# Patient Record
Sex: Female | Born: 2000 | Hispanic: No | Marital: Single | Smoking: Never smoker
Health system: Southern US, Community
[De-identification: ages and names within clinical notes are randomized; demographics above are authoritative.]

## PROBLEM LIST (undated history)

## (undated) DIAGNOSIS — R7303 Prediabetes: Secondary | ICD-10-CM

## (undated) DIAGNOSIS — E7800 Pure hypercholesterolemia, unspecified: Secondary | ICD-10-CM

## (undated) DIAGNOSIS — G43909 Migraine, unspecified, not intractable, without status migrainosus: Secondary | ICD-10-CM

---
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
## 2023-03-16 ENCOUNTER — Encounter (HOSPITAL_COMMUNITY): Payer: Self-pay | Admitting: Obstetrics and Gynecology

## 2023-03-16 ENCOUNTER — Inpatient Hospital Stay (HOSPITAL_COMMUNITY)
Admission: AD | Admit: 2023-03-16 | Discharge: 2023-03-16 | Disposition: A | Discharge status: Home / Self Care | Payer: Medicaid Other | Attending: Obstetrics and Gynecology | Admitting: Obstetrics and Gynecology

## 2023-03-16 DIAGNOSIS — R109 Unspecified abdominal pain: Secondary | ICD-10-CM | POA: Insufficient documentation

## 2023-03-16 DIAGNOSIS — O26892 Other specified pregnancy related conditions, second trimester: Secondary | ICD-10-CM | POA: Diagnosis not present

## 2023-03-16 DIAGNOSIS — E7800 Pure hypercholesterolemia, unspecified: Secondary | ICD-10-CM | POA: Diagnosis not present

## 2023-03-16 DIAGNOSIS — O0931 Supervision of pregnancy with insufficient antenatal care, first trimester: Secondary | ICD-10-CM | POA: Insufficient documentation

## 2023-03-16 DIAGNOSIS — O99281 Endocrine, nutritional and metabolic diseases complicating pregnancy, first trimester: Secondary | ICD-10-CM | POA: Diagnosis not present

## 2023-03-16 DIAGNOSIS — O26891 Other specified pregnancy related conditions, first trimester: Secondary | ICD-10-CM | POA: Insufficient documentation

## 2023-03-16 DIAGNOSIS — R300 Dysuria: Secondary | ICD-10-CM | POA: Insufficient documentation

## 2023-03-16 DIAGNOSIS — Z3A13 13 weeks gestation of pregnancy: Secondary | ICD-10-CM | POA: Diagnosis not present

## 2023-03-16 LAB — URINALYSIS, ROUTINE W REFLEX MICROSCOPIC
Bilirubin Urine: NEGATIVE
Glucose, UA: NEGATIVE mg/dL
Hgb urine dipstick: NEGATIVE
Ketones, ur: NEGATIVE mg/dL
Leukocytes,Ua: NEGATIVE
Nitrite: NEGATIVE
Protein, ur: 30 mg/dL — AB
Specific Gravity, Urine: 1.027 (ref 1.005–1.030)
pH: 5 (ref 5.0–8.0)

## 2023-03-16 NOTE — Discharge Instructions (Signed)
[facility] Providers  Below are a list of OB providers other than [facility] who accept [employer] for [employer] [facility] [employer] [facility]             [address] [place], [place] [address] [phone]  Center for [employer] at [facility]                                                             [address], [address], [place], [place], [address] [phone]  Center for [facility] [address], [address], [place], [place], [address] [phone]  Center for [facility] [address], [place], [place], [address] [phone]  Center for [facility]                                 [address] [place], [place], [place], [address] [phone]  Center for [facility] at [facility]                                    [address] [place], [place], [address] [phone]  Center for [facility] at [facility] [address], [address], [place], [place], [address]                              [facility] [address], [place], [place], [address] [phone]  [facility]    Phone: [phone]  [facility]      Phone: [phone]

## 2023-03-16 NOTE — MAU Provider Note (Signed)
History     [id number]  Arrival date and time: 03/16/23 1211    Chief Complaint  Patient presents with   Abdominal Pain     HPI Donna Rich is a 22 y.o. at [redacted]w[redacted]d by outside 7 wk [place], who presents for abdominal pain.   Review of records from Care Everywhere: OB US from 02/01/2023 showed IUP w [facility] of 09/18/2023  Patient reports she had abdominal pain on and off for about five minutes this morning When pain would happen would last about a minute Sharp, R sided She currently has no pain No vaginal bleeding or leaking fluid No burning or pain with urination No vaginal discharge Denies constipation Has had pain once before and was told it was normal Just wants to make sure everything is ok since this is her first pregnancy      OB History     Gravida  1   Para      Term      Preterm      AB      Living         SAB      IAB      Ectopic      Multiple      Live Births              Past Medical History:  Diagnosis Date   High cholesterol    Migraine    Pre-diabetes     No past surgical history on file.  Family History  Problem Relation Age of Onset   Healthy Mother     Social History   Socioeconomic History   Marital status: Single    Spouse name: Not on file   Number of children: Not on file   Years of education: Not on file   Highest education level: Not on file  Occupational History   Not on file  Tobacco Use   Smoking status: Never   Smokeless tobacco: Never  Vaping Use   Vaping Use: Never used  Substance and Sexual Activity   Alcohol use: Never   Drug use: Never   Sexual activity: Not on file  Other Topics Concern   Not on file  Social History Narrative   Not on file   Social Determinants of Health   Financial Resource Strain: Not on file  Food Insecurity: Not on file  Transportation Needs: Not on file  Physical Activity: Not on file  Stress: Not on file  Social Connections: Not on file  Intimate Partner  Violence: Not on file    No Known Allergies  No current facility-administered medications on file prior to encounter.   Current Outpatient Medications on File Prior to Encounter  Medication Sig Dispense Refill   cetirizine (ZYRTEC ALLERGY) 10 MG tablet Take 1 tablet (10 mg total) by mouth daily. 30 tablet 0   lidocaine (XYLOCAINE) 2 % solution Use as directed 15 mLs in the mouth or throat every 4 (four) hours as needed for mouth pain. 100 mL 0     ROS Pertinent positives and negative per HPI, all others reviewed and negative  Physical Exam   BP 130/77 (BP Location: Right Arm)   Pulse 98   Temp 98.6 F (37 C) (Oral)   Resp 14   Ht 5\' 1"  (1.549 m)   Wt 100.6 kg   SpO2 99%   BMI 41.89 kg/m   Patient Vitals for the past 24 hrs:  BP Temp Temp src Pulse Resp  SpO2 Height Weight  03/16/23 1223 130/77 98.6 F (37 C) Oral 98 14 99 % -- --  03/16/23 1220 -- -- -- -- -- -- 5\' 1"  (1.549 m) 100.6 kg    Physical Exam Vitals reviewed.  Constitutional:      General: She is not in acute distress.    Appearance: She is well-developed. She is not diaphoretic.  Eyes:     General: No scleral icterus. Pulmonary:     Effort: Pulmonary effort is normal. No respiratory distress.  Abdominal:     General: There is no distension.     Palpations: Abdomen is soft.     Tenderness: There is no abdominal tenderness. There is no guarding or rebound.  Skin:    General: Skin is warm and dry.  Neurological:     Mental Status: She is alert.     Coordination: Coordination normal.      Cervical Exam    Bedside Ultrasound Not performed.  My interpretation: n/a  FHT 158 bpm by doppler  Labs No results found for this or any previous visit (from the past 24 hour(s)).  Imaging No results found.  MAU Course  Procedures Lab Orders         Urinalysis, Routine w reflex microscopic -Urine, Clean Catch    No orders of the defined types were placed in this encounter.  Imaging Orders  No  imaging studies ordered today    MDM Moderate (Level 3-4)  Assessment and Plan  #Abdominal pain in pregnancy, second trimester #[redacted] weeks gestation of pregnancy Reassured patient that in absence of ongoing pain (she has none at present) or any other symptoms such as bleeding/dysuria/discharge/etc., low concern for any pathology though also no interventions if there are issues with pregnancy regardless. Reassured likely due to advancing gestational age/round ligament pain.  Has not yet established prenatal care, would like to go to [facility]. Encouraged to schedule appt ASAP, also given list of other providers that accept Medicaid in the area.   #FWB FHR 158 bpm by doppler   Dispo: discharged to home in stable condition    Venora Maples, MD/MPH 03/16/23 1:21 PM  Allergies as of 03/16/2023   No Known Allergies      Medication List     TAKE these medications    cetirizine 10 MG tablet Commonly known as: ZyrTEC Allergy Take 1 tablet (10 mg total) by mouth daily.   lidocaine 2 % solution Commonly known as: XYLOCAINE Use as directed 15 mLs in the mouth or throat every 4 (four) hours as needed for mouth pain.

## 2023-03-16 NOTE — MAU Note (Addendum)
.  Donna Rich is a 22 y.o. at [redacted]w[redacted]d here in MAU reporting: sharp right lower abdominal pain that started at 0830 this morning, states it has only happened x3 but she wanted to come get it checked out. Denies VB or abnormal discharge/itching or odor. Last had IC this morning prior to the pain starting. Patient took tylenol yesterday.   Onset of complaint: today at 0830 Pain score: 10 this morning but denies pain currently, last felt the pain with movement x1 hour ago Vitals:   03/16/23 1223  BP: 130/77  Pulse: 98  Resp: 14  Temp: 98.6 F (37 C)  SpO2: 99%     FHT:158 Lab orders placed from triage:  UA

## 2024-02-20 ENCOUNTER — Ambulatory Visit (HOSPITAL_COMMUNITY)
Admission: EM | Admit: 2024-02-20 | Discharge: 2024-02-20 | Disposition: A | Discharge status: Home / Self Care | Attending: Emergency Medicine | Admitting: Emergency Medicine

## 2024-02-20 ENCOUNTER — Encounter (HOSPITAL_COMMUNITY): Payer: Self-pay

## 2024-02-20 DIAGNOSIS — Z3202 Encounter for pregnancy test, result negative: Secondary | ICD-10-CM | POA: Diagnosis present

## 2024-02-20 DIAGNOSIS — Z113 Encounter for screening for infections with a predominantly sexual mode of transmission: Secondary | ICD-10-CM | POA: Diagnosis present

## 2024-02-20 DIAGNOSIS — N938 Other specified abnormal uterine and vaginal bleeding: Secondary | ICD-10-CM | POA: Insufficient documentation

## 2024-02-20 LAB — POCT URINALYSIS DIP (MANUAL ENTRY)
Bilirubin, UA: NEGATIVE
Glucose, UA: NEGATIVE mg/dL
Ketones, POC UA: NEGATIVE mg/dL
Nitrite, UA: NEGATIVE
Protein Ur, POC: 100 mg/dL — AB
Spec Grav, UA: 1.025 (ref 1.010–1.025)
Urobilinogen, UA: 0.2 U/dL
pH, UA: 6 (ref 5.0–8.0)

## 2024-02-20 LAB — POCT URINE PREGNANCY: Preg Test, Ur: NEGATIVE

## 2024-02-20 MED ORDER — ESTRADIOL 1 MG PO TABS: 1.0000 mg | ORAL_TABLET | Freq: Every day | ORAL | 0 refills | Status: AC

## 2024-02-20 NOTE — Discharge Instructions (Addendum)
 Take the Estrace  1 mg tablet by mouth daily to help stop your vaginal bleeding.  Please follow-up with your OB/GYN regarding further management of your menstrual cycles and contraception.  Sexually-transmitted infection tests were done today, our staff will contact you if the results are abnormal to initiate the appropriate treatment.  Abstain from intercourse until all results have been received.  Our staff will not call you if the results are normal, I suggest setting up MyChart to view these.  Return to clinic for any new or urgent symptoms.

## 2024-02-20 NOTE — ED Provider Notes (Signed)
 [facility]    CSN: [id number] Arrival date & time: 02/20/24  1200      History   Chief Complaint Chief Complaint  Patient presents with   Abdominal Pain   Pelvic Pain   Vaginal Discharge    HPI Donna Rich is a 23 y.o. female.   Patient presents to clinic over concerns of abdominal and pelvic pain that has been ongoing for the past week, but got much worse over the past few days.  She did have some pink vaginal discharge that turned heavier today when she went to the bathroom.  She is not bleeding in her underwear, but notices blood when she wipes.  She does have the Mirena IUD, position verified in March.  Was placed on estradiol  for breakthrough vaginal bleeding, took these for a month and these help, she has since finished this medication.  Is sexually active with her child's father.  Patient is not breast-feeding.  Reports her sexual partner has other sexual partners and is unfaithful.  Vaginal itching or vaginal discharge with odor.  Denies low back pain.  Denies dysuria.  Has not had fevers.  The history is provided by the patient, a parent and medical records.  Abdominal Pain Pelvic Pain  Vaginal Discharge   Past Medical History:  Diagnosis Date   High cholesterol    Migraine    Pre-diabetes     There are no active problems to display for this patient.   Past Surgical History:  Procedure Laterality Date   CESAREAN SECTION      OB History     Gravida  1   Para      Term      Preterm      AB      Living         SAB      IAB      Ectopic      Multiple      Live Births               Home Medications    Prior to Admission medications   Medication Sig Start Date End Date Taking? Authorizing Provider  estradiol  (ESTRACE ) 1 MG tablet Take 1 tablet (1 mg total) by mouth daily. 02/20/24  Yes Harlow Lighter, Eliav Mechling  N, FNP    Family History Family History  Problem Relation Age of Onset   Healthy Mother     Social  History Social History   Tobacco Use   Smoking status: Never   Smokeless tobacco: Never  Vaping Use   Vaping status: Never Used  Substance Use Topics   Alcohol use: Never   Drug use: Never     Allergies   Patient has no known allergies.   Review of Systems Review of Systems  Per HPI  Physical Exam Triage Vital Signs ED Triage Vitals  Encounter Vitals Group     BP 02/20/24 1346 124/84     Systolic BP Percentile --      Diastolic BP Percentile --      Pulse Rate 02/20/24 1346 91     Resp 02/20/24 1346 16     Temp 02/20/24 1346 98.1 F (36.7 C)     Temp Source 02/20/24 1346 Oral     SpO2 02/20/24 1346 98 %     Weight --      Height --      Head Circumference --      Peak Flow --  Pain Score 02/20/24 1345 5     Pain Loc --      Pain Education --      Exclude from Growth Chart --    No data found.  Updated Vital Signs BP 124/84 (BP Location: Left Arm)   Pulse 91   Temp 98.1 F (36.7 C) (Oral)   Resp 16   LMP 01/24/2024   SpO2 98%   Visual Acuity Right Eye Distance:   Left Eye Distance:   Bilateral Distance:    Right Eye Near:   Left Eye Near:    Bilateral Near:     Physical Exam Vitals and nursing note reviewed.  Constitutional:      Appearance: Normal appearance. She is well-developed.  HENT:     Head: Normocephalic and atraumatic.     Right Ear: External ear normal.     Left Ear: External ear normal.     Nose: Nose normal.     Mouth/Throat:     Mouth: Mucous membranes are moist.  Eyes:     Conjunctiva/sclera: Conjunctivae normal.  Cardiovascular:     Rate and Rhythm: Normal rate.  Pulmonary:     Effort: Pulmonary effort is normal. No respiratory distress.  Abdominal:     General: Abdomen is flat. Bowel sounds are normal.     Palpations: Abdomen is soft.     Tenderness: There is abdominal tenderness. There is no guarding or rebound.     Hernia: No hernia is present.     Comments: Mild LLQ TTP, w/o guarding or rebound    Musculoskeletal:        General: Normal range of motion.  Skin:    General: Skin is warm and dry.  Neurological:     General: No focal deficit present.     Mental Status: She is alert.  Psychiatric:        Mood and Affect: Mood normal.        Behavior: Behavior normal. Behavior is cooperative.      UC Treatments / Results  Labs (all labs ordered are listed, but only abnormal results are displayed) Labs Reviewed  POCT URINALYSIS DIP (MANUAL ENTRY) - Abnormal; Notable for the following components:      Result Value   Color, UA straw (*)    Clarity, UA hazy (*)    Blood, UA large (*)    Protein Ur, POC =100 (*)    Leukocytes, UA Trace (*)    All other components within normal limits  RPR  HIV ANTIBODY (ROUTINE TESTING W REFLEX)  POCT URINE PREGNANCY  CERVICOVAGINAL ANCILLARY ONLY    EKG   Radiology No results found.  Procedures Procedures (including critical care time)  Medications Ordered in UC Medications - No data to display  Initial Impression / Assessment and Plan / UC Course  I have reviewed the triage vital signs and the nursing notes.  Pertinent labs & imaging results that were available during my care of the patient were reviewed by me and considered in my medical decision making (see chart for details).  Vitals in triage reviewed, patient is hemodynamically stable.  Abdomen is soft with active bowel sounds, mild left lower quadrant tenderness.  Without rebound or guarding.  Denies urinary symptoms.  IUD checked in March by OB/GYN.  Urine pregnancy negative.  Urinalysis with red blood cells, suspect from vaginal bleeding.  No evidence of UTI.  Will place back on Estrace  for 1 month and encourage OB/GYN follow-up for dysfunctional uterine bleeding.  STI screening done today and staff will contact if results are abnormal.  Plan of care, follow-up care return precautions given, no questions at this time.     Final Clinical Impressions(s) / UC Diagnoses    Final diagnoses:  Dysfunctional uterine bleeding  Screening examination for sexually transmitted disease  Urine pregnancy test negative     Discharge Instructions      Take the Estrace  1 mg tablet by mouth daily to help stop your vaginal bleeding.  Please follow-up with your OB/GYN regarding further management of your menstrual cycles and contraception.  Sexually-transmitted infection tests were done today, our staff will contact you if the results are abnormal to initiate the appropriate treatment.  Abstain from intercourse until all results have been received.  Our staff will not call you if the results are normal, I suggest setting up MyChart to view these.  Return to clinic for any new or urgent symptoms.    ED Prescriptions     Medication Sig Dispense Auth. Provider   estradiol  (ESTRACE ) 1 MG tablet Take 1 tablet (1 mg total) by mouth daily. 30 tablet Harlow Lighter, Panhia Karl  N, FNP      PDMP not reviewed this encounter.   Harlow Lighter, Trinda Harlacher  N, FNP 02/20/24 1435

## 2024-02-20 NOTE — ED Triage Notes (Signed)
 Patient reports that she has had abdominal pain, pelvic pain x 2 weeks.Patient has an IUD. Patient states that she has been having pink vaginal discharge.  Patient states she has ben taking Ibuprofen and Tylenol.

## 2024-02-21 ENCOUNTER — Ambulatory Visit (HOSPITAL_COMMUNITY): Payer: Self-pay

## 2024-02-21 LAB — MISC LABCORP TEST (SEND OUT): Labcorp test code: 83935

## 2024-02-21 LAB — CERVICOVAGINAL ANCILLARY ONLY
Bacterial Vaginitis (gardnerella): POSITIVE — AB
Candida Glabrata: NEGATIVE
Candida Vaginitis: NEGATIVE
Chlamydia: NEGATIVE
Comment: NEGATIVE
Comment: NEGATIVE
Comment: NEGATIVE
Comment: NEGATIVE
Comment: NEGATIVE
Comment: NORMAL
Neisseria Gonorrhea: NEGATIVE
Trichomonas: NEGATIVE

## 2024-02-21 LAB — RPR: RPR Ser Ql: NONREACTIVE

## 2024-02-21 MED ORDER — METRONIDAZOLE 500 MG PO TABS: 500.0000 mg | ORAL_TABLET | Freq: Two times a day (BID) | ORAL | 0 refills | Status: AC
